# Patient Record
Sex: Male | Born: 1935 | Race: White | Hispanic: No
Health system: Southern US, Community
[De-identification: ages and names within clinical notes are randomized; demographics above are authoritative.]

---

## 2010-09-16 ENCOUNTER — Observation Stay: Payer: Self-pay | Admitting: Family Medicine

## 2011-05-17 ENCOUNTER — Emergency Department: Payer: Self-pay | Admitting: Emergency Medicine

## 2011-08-14 ENCOUNTER — Ambulatory Visit: Payer: Self-pay | Admitting: Cardiology

## 2011-08-14 ENCOUNTER — Emergency Department: Payer: Self-pay | Admitting: *Deleted

## 2011-08-19 ENCOUNTER — Ambulatory Visit: Payer: Self-pay | Admitting: Cardiology

## 2012-01-04 ENCOUNTER — Emergency Department: Payer: Self-pay | Admitting: Emergency Medicine

## 2012-01-09 ENCOUNTER — Inpatient Hospital Stay: Payer: Self-pay | Admitting: Family Medicine

## 2012-01-09 LAB — URINALYSIS, COMPLETE
Bilirubin,UR: NEGATIVE
Blood: NEGATIVE
Glucose,UR: NEGATIVE mg/dL (ref 0–75)
Hyaline Cast: 3
Ketone: NEGATIVE
Leukocyte Esterase: NEGATIVE
Ph: 6 (ref 4.5–8.0)
Protein: NEGATIVE
RBC,UR: NONE SEEN /HPF (ref 0–5)
Specific Gravity: 1.009 (ref 1.003–1.030)
WBC UR: NONE SEEN /HPF (ref 0–5)

## 2012-01-09 LAB — TROPONIN I: Troponin-I: 0.06 ng/mL — ABNORMAL HIGH

## 2012-01-09 LAB — CBC
HCT: 31.8 % — ABNORMAL LOW (ref 40.0–52.0)
HGB: 10.7 g/dL — ABNORMAL LOW (ref 13.0–18.0)
MCHC: 33.6 g/dL (ref 32.0–36.0)
MCV: 99 fL (ref 80–100)
Platelet: 189 10*3/uL (ref 150–440)
RBC: 3.21 10*6/uL — ABNORMAL LOW (ref 4.40–5.90)
RDW: 15.1 % — ABNORMAL HIGH (ref 11.5–14.5)

## 2012-01-09 LAB — COMPREHENSIVE METABOLIC PANEL
Alkaline Phosphatase: 90 U/L (ref 50–136)
Anion Gap: 7 (ref 7–16)
BUN: 70 mg/dL — ABNORMAL HIGH (ref 7–18)
Bilirubin,Total: 0.4 mg/dL (ref 0.2–1.0)
Co2: 28 mmol/L (ref 21–32)
Creatinine: 2.7 mg/dL — ABNORMAL HIGH (ref 0.60–1.30)
EGFR (African American): 23 — ABNORMAL LOW
EGFR (Non-African Amer.): 20 — ABNORMAL LOW
SGOT(AST): 14 U/L — ABNORMAL LOW (ref 15–37)
SGPT (ALT): 15 U/L
Sodium: 142 mmol/L (ref 136–145)
Total Protein: 7.3 g/dL (ref 6.4–8.2)

## 2012-01-09 LAB — CK TOTAL AND CKMB (NOT AT ARMC): CK, Total: 29 U/L — ABNORMAL LOW (ref 35–232)

## 2012-01-10 LAB — BASIC METABOLIC PANEL
Anion Gap: 9 (ref 7–16)
Calcium, Total: 8.4 mg/dL — ABNORMAL LOW (ref 8.5–10.1)
Chloride: 106 mmol/L (ref 98–107)
Co2: 24 mmol/L (ref 21–32)
EGFR (African American): 14 — ABNORMAL LOW
EGFR (Non-African Amer.): 12 — ABNORMAL LOW
Glucose: 104 mg/dL — ABNORMAL HIGH (ref 65–99)
Potassium: 5 mmol/L (ref 3.5–5.1)
Sodium: 139 mmol/L (ref 136–145)

## 2012-01-10 LAB — CBC WITH DIFFERENTIAL/PLATELET
Eosinophil #: 0 10*3/uL (ref 0.0–0.7)
HCT: 30.7 % — ABNORMAL LOW (ref 40.0–52.0)
Lymphocyte #: 1 10*3/uL (ref 1.0–3.6)
Lymphocyte %: 8.4 %
MCHC: 32.8 g/dL (ref 32.0–36.0)
Monocyte %: 7.8 %
Neutrophil #: 9.7 10*3/uL — ABNORMAL HIGH (ref 1.4–6.5)
Platelet: 183 10*3/uL (ref 150–440)
RBC: 3.1 10*6/uL — ABNORMAL LOW (ref 4.40–5.90)
WBC: 11.7 10*3/uL — ABNORMAL HIGH (ref 3.8–10.6)

## 2012-01-11 LAB — CBC WITH DIFFERENTIAL/PLATELET
Basophil #: 0 10*3/uL (ref 0.0–0.1)
Basophil %: 0.4 %
HGB: 9.9 g/dL — ABNORMAL LOW (ref 13.0–18.0)
Lymphocyte %: 16.8 %
MCH: 32.9 pg (ref 26.0–34.0)
MCV: 99 fL (ref 80–100)
Monocyte #: 0.9 x10 3/mm (ref 0.2–1.0)
Neutrophil %: 68.8 %
Platelet: 177 10*3/uL (ref 150–440)
RBC: 3.02 10*6/uL — ABNORMAL LOW (ref 4.40–5.90)
RDW: 14.9 % — ABNORMAL HIGH (ref 11.5–14.5)
WBC: 8 10*3/uL (ref 3.8–10.6)

## 2012-01-11 LAB — BASIC METABOLIC PANEL
BUN: 98 mg/dL — ABNORMAL HIGH (ref 7–18)
Creatinine: 4.12 mg/dL — ABNORMAL HIGH (ref 0.60–1.30)
EGFR (African American): 14 — ABNORMAL LOW
EGFR (Non-African Amer.): 12 — ABNORMAL LOW
Osmolality: 309 (ref 275–301)

## 2012-01-12 LAB — CBC WITH DIFFERENTIAL/PLATELET
Basophil %: 0.4 %
Eosinophil #: 0.2 10*3/uL (ref 0.0–0.7)
Eosinophil %: 2 %
HGB: 10.2 g/dL — ABNORMAL LOW (ref 13.0–18.0)
MCH: 33.4 pg (ref 26.0–34.0)
MCHC: 33.7 g/dL (ref 32.0–36.0)
MCV: 99 fL (ref 80–100)
Monocyte #: 0.9 x10 3/mm (ref 0.2–1.0)
Monocyte %: 10.4 %
Platelet: 184 10*3/uL (ref 150–440)

## 2012-01-12 LAB — BASIC METABOLIC PANEL
Calcium, Total: 8.3 mg/dL — ABNORMAL LOW (ref 8.5–10.1)
Chloride: 110 mmol/L — ABNORMAL HIGH (ref 98–107)
Co2: 24 mmol/L (ref 21–32)
Creatinine: 3.36 mg/dL — ABNORMAL HIGH (ref 0.60–1.30)
EGFR (African American): 18 — ABNORMAL LOW
EGFR (Non-African Amer.): 15 — ABNORMAL LOW
Glucose: 91 mg/dL (ref 65–99)
Osmolality: 309 (ref 275–301)
Sodium: 142 mmol/L (ref 136–145)

## 2012-01-12 LAB — CK: CK, Total: 55 U/L (ref 35–232)

## 2012-01-12 LAB — IRON AND TIBC
Iron Saturation: 17 %
Iron: 41 ug/dL — ABNORMAL LOW (ref 65–175)

## 2012-01-12 LAB — PHOSPHORUS: Phosphorus: 3.8 mg/dL (ref 2.5–4.9)

## 2012-01-13 LAB — CBC WITH DIFFERENTIAL/PLATELET
Basophil %: 0.4 %
Eosinophil #: 0.1 10*3/uL (ref 0.0–0.7)
Eosinophil %: 1.3 %
HCT: 31.8 % — ABNORMAL LOW (ref 40.0–52.0)
Lymphocyte %: 12.7 %
MCH: 32.7 pg (ref 26.0–34.0)
MCHC: 32.8 g/dL (ref 32.0–36.0)
MCV: 100 fL (ref 80–100)
Monocyte #: 1 x10 3/mm (ref 0.2–1.0)
Neutrophil #: 6.1 10*3/uL (ref 1.4–6.5)
Neutrophil %: 73.8 %
Platelet: 189 10*3/uL (ref 150–440)
RBC: 3.18 10*6/uL — ABNORMAL LOW (ref 4.40–5.90)
RDW: 15.3 % — ABNORMAL HIGH (ref 11.5–14.5)
WBC: 8.3 10*3/uL (ref 3.8–10.6)

## 2012-01-13 LAB — BASIC METABOLIC PANEL
Anion Gap: 8 (ref 7–16)
BUN: 75 mg/dL — ABNORMAL HIGH (ref 7–18)
Calcium, Total: 8.4 mg/dL — ABNORMAL LOW (ref 8.5–10.1)
Chloride: 111 mmol/L — ABNORMAL HIGH (ref 98–107)
Co2: 23 mmol/L (ref 21–32)
EGFR (African American): 20 — ABNORMAL LOW
Glucose: 88 mg/dL (ref 65–99)
Potassium: 4.8 mmol/L (ref 3.5–5.1)

## 2012-01-14 LAB — BASIC METABOLIC PANEL
Anion Gap: 7 (ref 7–16)
BUN: 73 mg/dL — ABNORMAL HIGH (ref 7–18)
Creatinine: 2.94 mg/dL — ABNORMAL HIGH (ref 0.60–1.30)
Osmolality: 306 (ref 275–301)
Sodium: 143 mmol/L (ref 136–145)

## 2012-02-07 DEATH — deceased

## 2012-12-03 IMAGING — CT CT CHEST-ABD-PELV W/O CM
1 of 2 series · 15 of 31 positions shown, 19 images · non-contrast
Comparison: None

REASON FOR EXAM: (1) REPORTED FALL YESTER DAY W/ LEFT RIB; (2) & LUQ PAIN
COMMENTS:   May transport without cardiac monitor

PROCEDURE:     CT  - CT CHEST ABDOMEN AND PELVIS WO  - January 04, 2012  [DATE]
RESULT:     CT CHEST, ABDOMEN, AND PELVIS
HISTORY: 
TECHNIQUE: Multiple axial images obtained from the thoracic inlet to the
pubic symphysis, without p.o. contrast and with with a intravenous contrast.

[Series 2: soft tissue · axial · 0.91mm/px · z∈[-1170,-575]mm · 15 of 133 slices shown, 19 images]
[im 7/133  mediastinal]
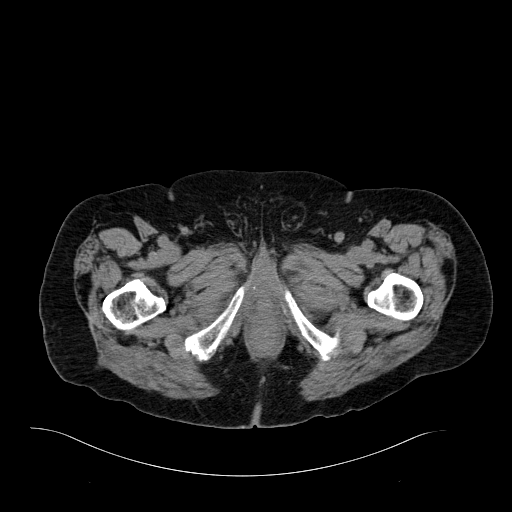
[im 7/133  bone]
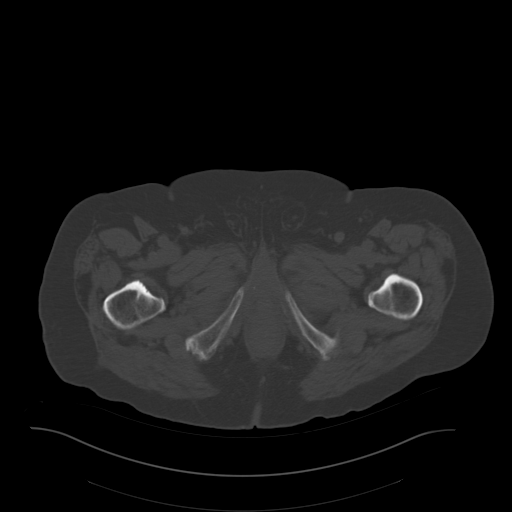
[im 21/133  mediastinal]
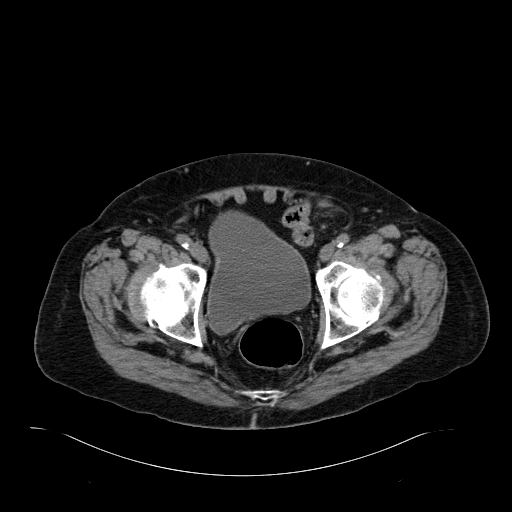
[im 35/133  mediastinal]
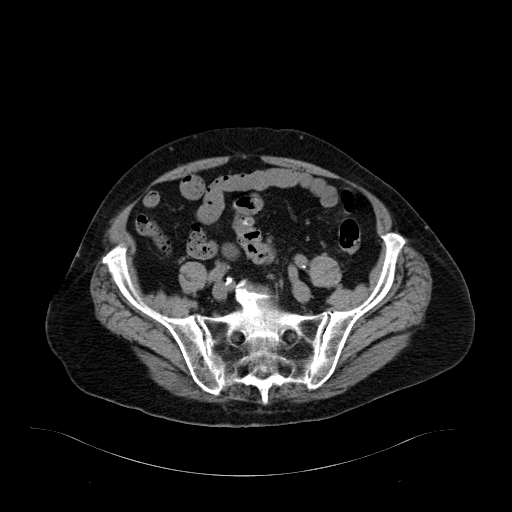
[im 42/133  mediastinal]
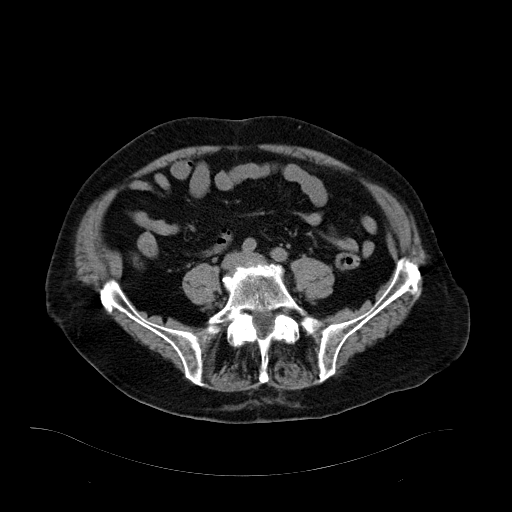
[im 49/133  mediastinal]
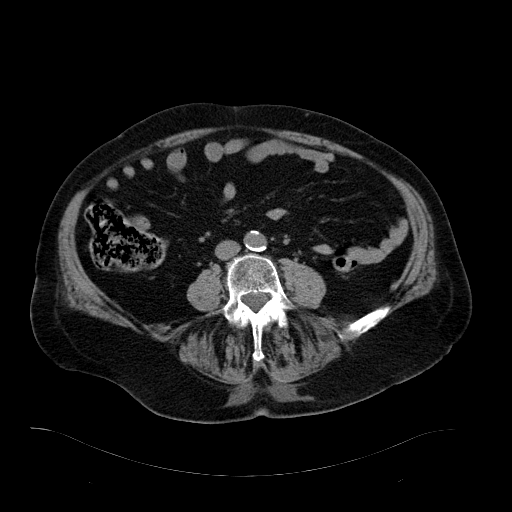
[im 56/133  mediastinal]
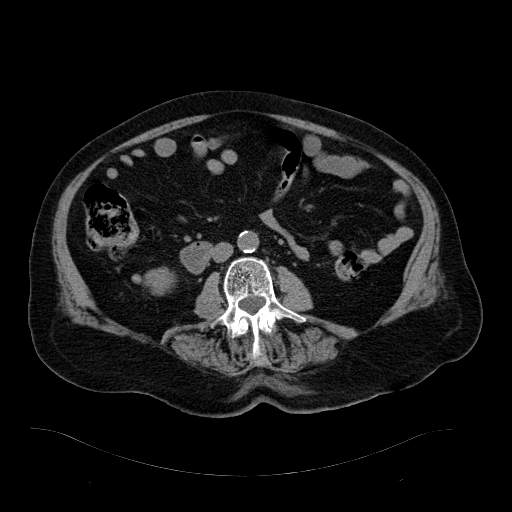
[im 65/133  mediastinal]
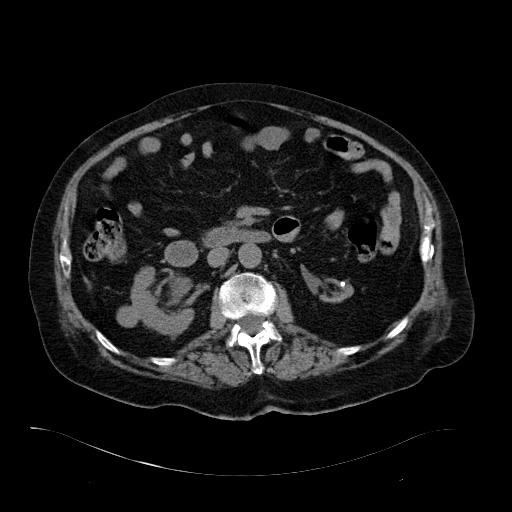
[im 77/133  mediastinal]
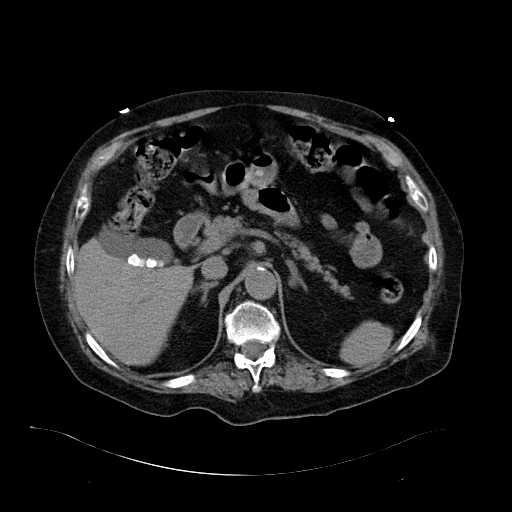
[im 84/133  mediastinal]
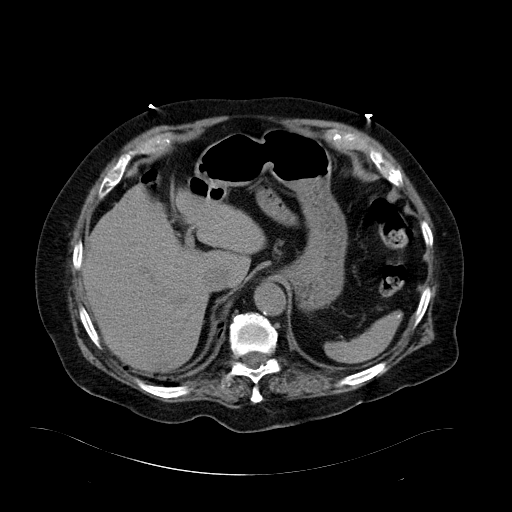
[im 84/133  bone]
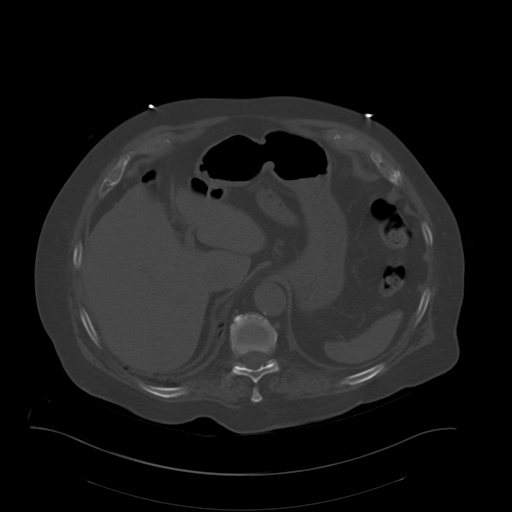
[im 91/133  mediastinal]
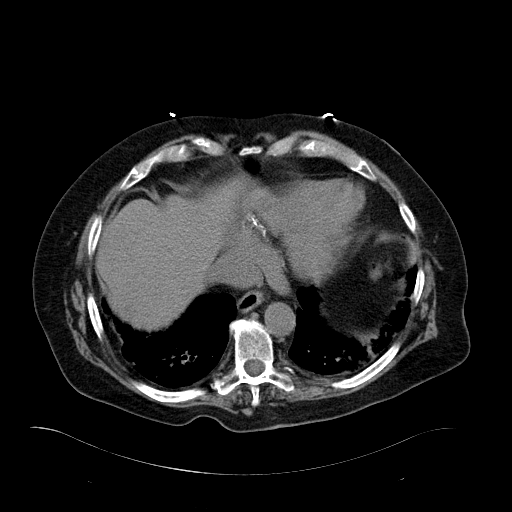
[im 98/133  mediastinal]
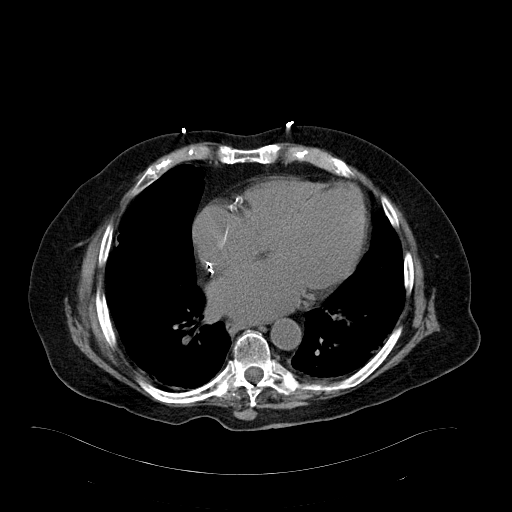
[im 105/133  lung]
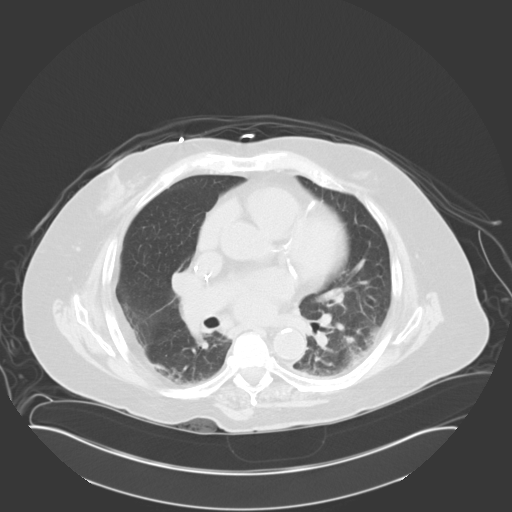
[im 112/133  mediastinal]
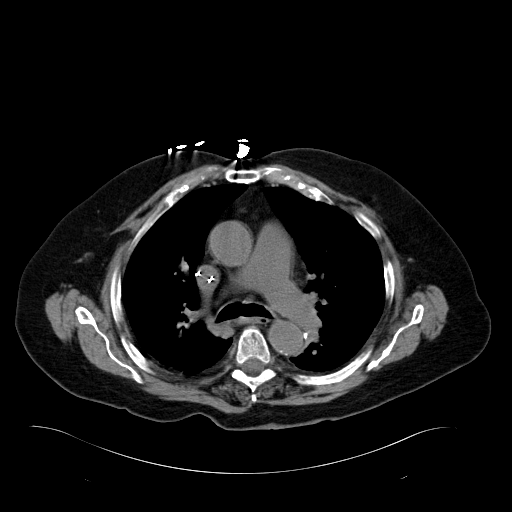
[im 112/133  lung]
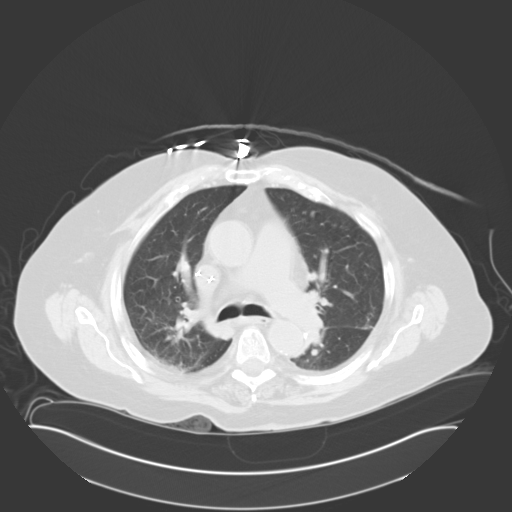
[im 119/133  lung]
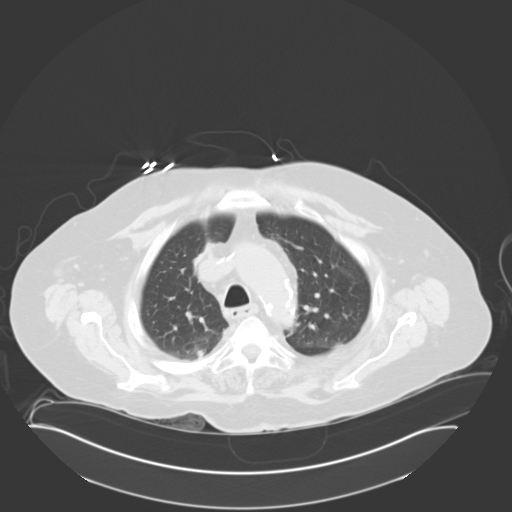
[im 126/133  mediastinal]
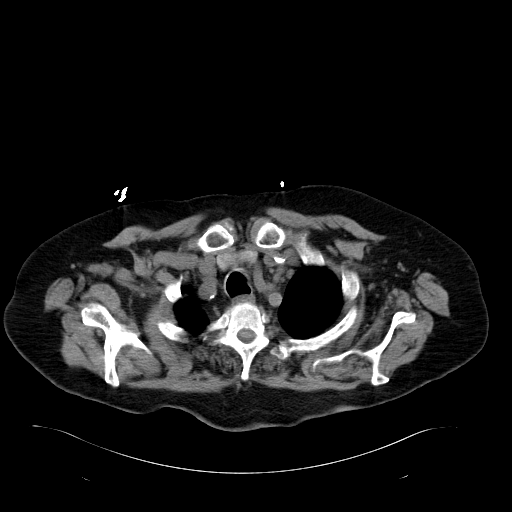
[im 126/133  lung]
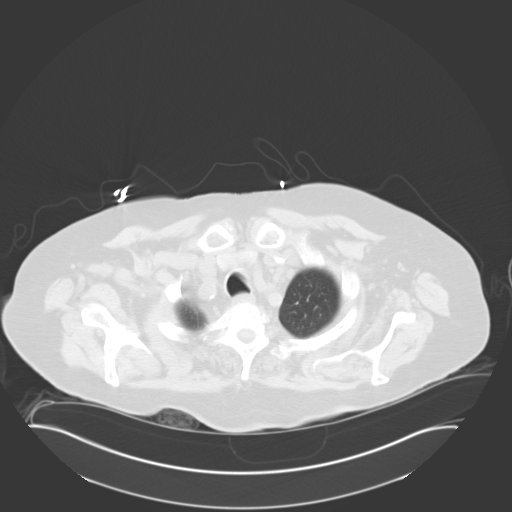

[15 of 31 positions shown; findings below may reference images not displayed]

FINDINGS: CHEST:

There is bibasilar interstitial thickening likely chronic with associated
bronchiectasis. There is no focal mass. There is no focal parenchymal
opacity, pleural effusion, or pneumothorax.

The heart size is enlarged. There is no pericardial effusion. There is
coronary artery atherosclerosis.

There are no pathologically enlarged mediastinal, hilar, or axillary lymph
nodes.

The osseous structures demonstrate no focal abnormality.

ABDOMEN/PELVIS:

The liver demonstrates no focal abnormality. There is no intrahepatic or
extrahepatic biliary ductal dilatation. There are cholelithiasis. The spleen
demonstrates no focal abnormality. The left kidney is severely atrophic. Is
a large parapelvic 3.7 cm cyst involving the right kidney. There is an
indeterminate 2.4 cm left renal interpolar mass. There is a hyperdense
indeterminate right upper pole renal mass measuring 2.3 cm. The  adrenal
glands, pancreas are normal. The bladder is unremarkable.

The stomach, duodenum, small intestine, and large intestine demonstrate no
contrast extravasation or dilatation. There is no pneumoperitoneum,
pneumatosis, or portal venous gas. There is no abdominal or pelvic free
fluid. There is no lymphadenopathy.

The abdominal aorta is normal in caliber with atherosclerosis.

There is lumbar spine spondylosis.
IMPRESSION: 1. No acute injury of the chest, abdomen or pelvis.

2. There are bibasilar chronic interstitial changes with bronchiectasis.

3. Cholelithiasis.

4. Coronary artery disease.

[REDACTED]

## 2012-12-08 IMAGING — CR DG CHEST 2V
1 series · 3 of 3 positions shown · non-contrast
Comparison: none

REASON FOR EXAM: RIGHT RIB PAIN SP FALL
COMMENTS:

[Series 1: x chest ap · 0.14mm/px · 3 of 3 slices shown]
[im 1/3]
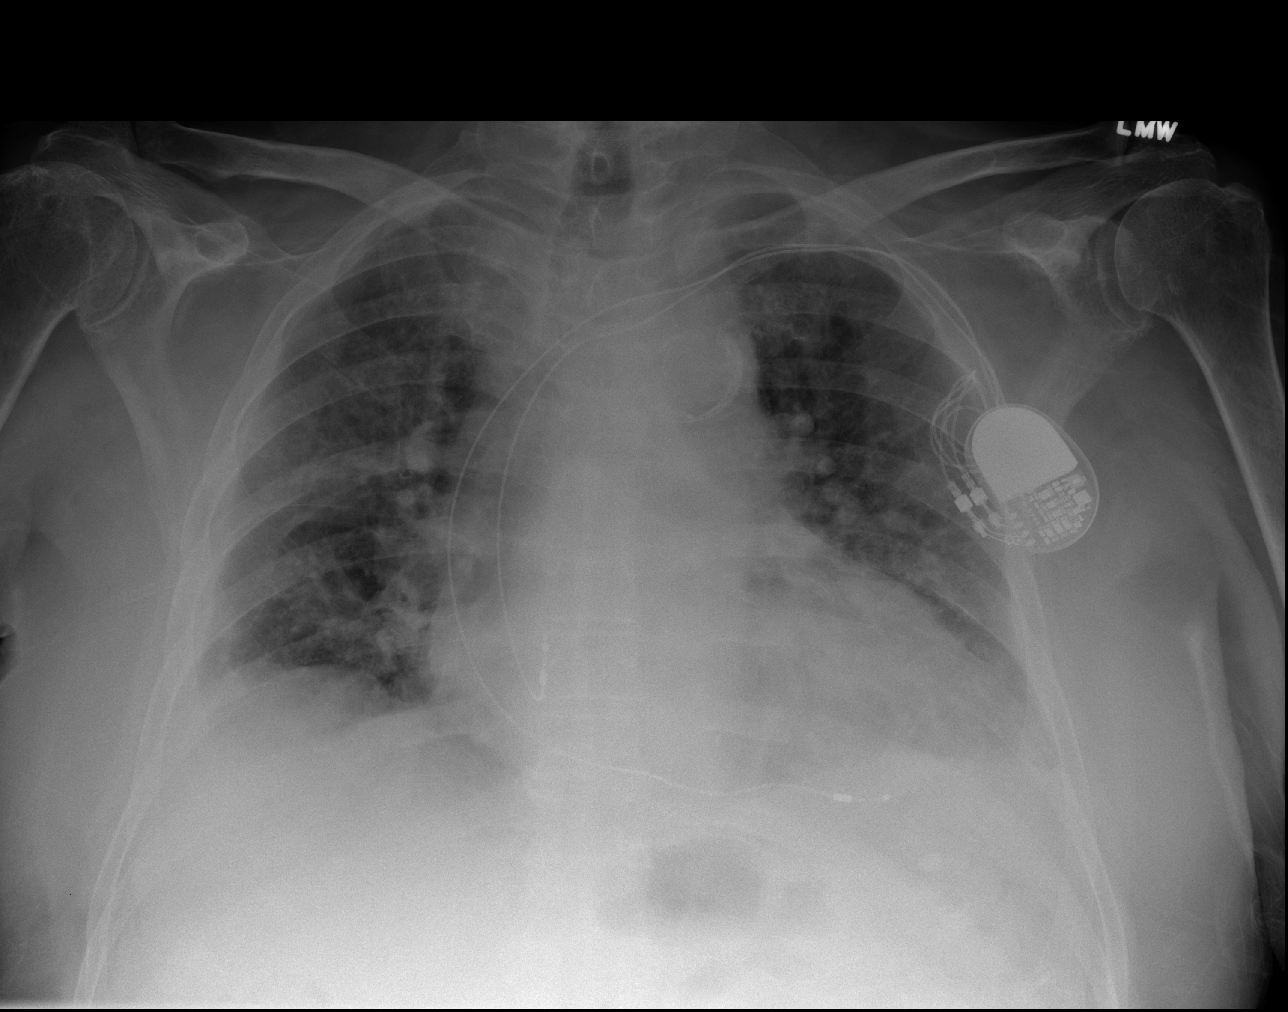
[im 2/3]
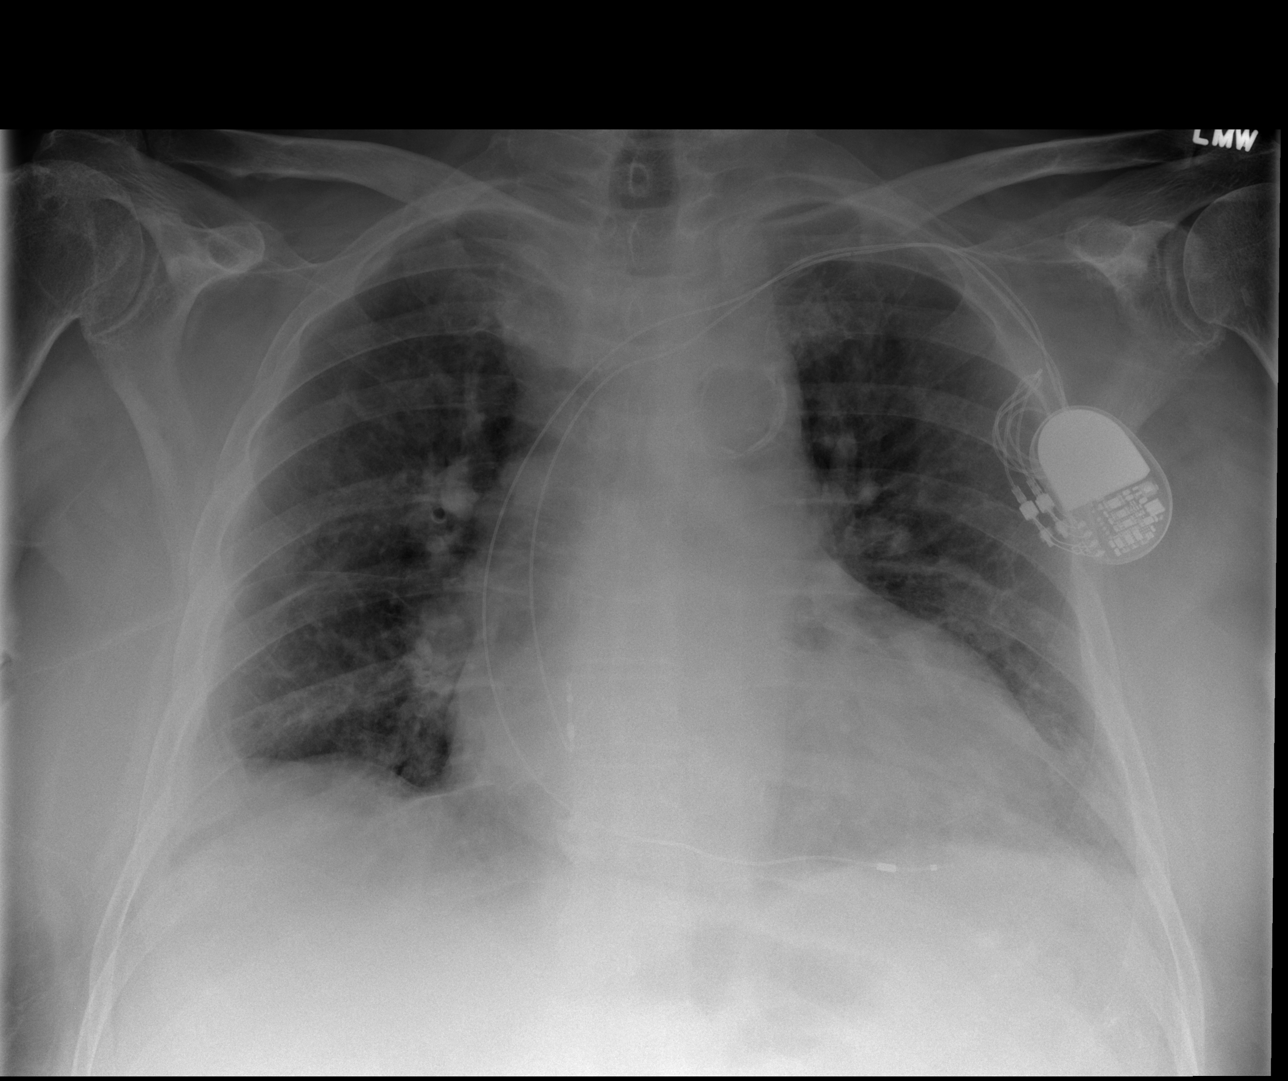
[im 3/3]
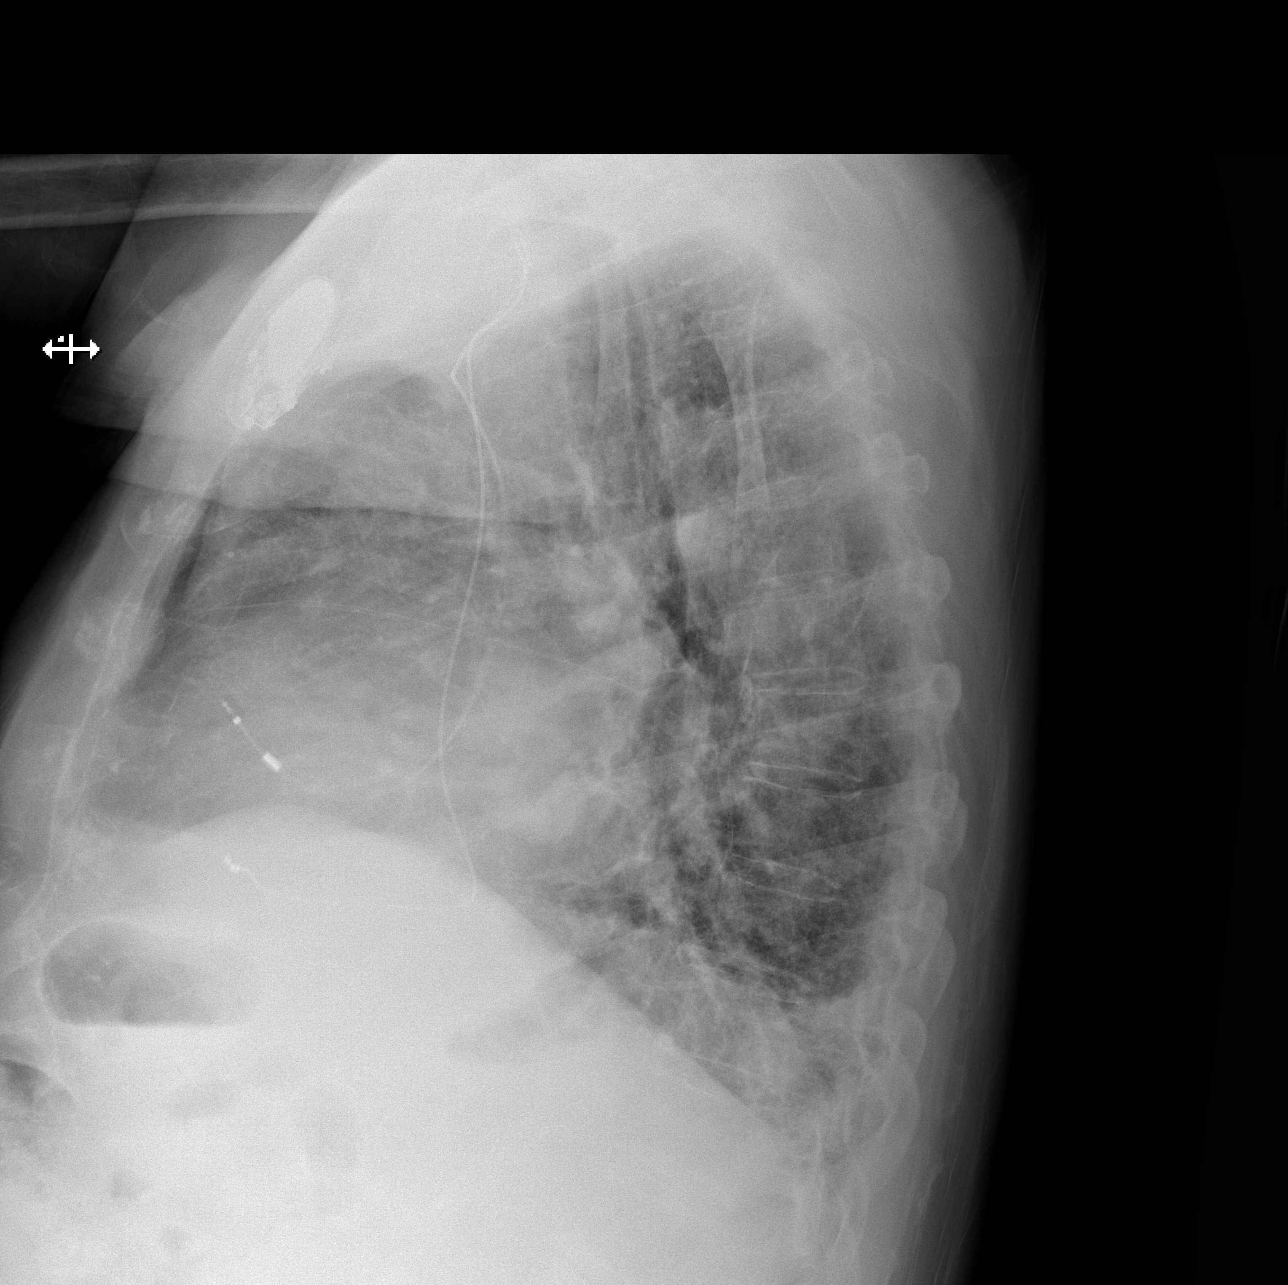

[3 of 3 positions shown; findings below may reference images not displayed]

PROCEDURE:     DXR - DXR CHEST PA (OR AP) AND LATERAL  - January 09, 2012  [DATE]

RESULT:     Comparison is made to the study 14 August, 2011.

The lung volumes are low. The interstitial markings are increased
bilaterally. The pulmonary vascularity is engorged. The cardiac silhouette
is enlarged. There is a permanent pacemaker in place.
IMPRESSION: The findings are consistent with pulmonary interstitial
edema likely of cardiac cause. I do not see evidence of a pneumothorax or
pulmonary contusion nor definite evidence of an acute rib fracture.

[REDACTED]

## 2014-12-31 NOTE — Discharge Summary (Signed)
PATIENT NAME:  Sherryle LisFOX, Zaxton N MR#:  [id number] DATE OF BIRTH:  1919-01-23  DATE OF ADMISSION:  01/09/2012 DATE OF DISCHARGE:  01/14/2012   DISCHARGE DIAGNOSES:  1. Severe dementia/delirium but back to baseline.  2. Noncardiac chest discomfort secondary to fall.  3. Acute on chronic renal failure. 4. Systolic congestive heart failure.   DISCHARGE MEDICATIONS:  1. Aspirin 81 mg p.o. daily.  2. Aricept 10 mg p.o. at bedtime.  3. Coreg 3.125 mg p.o. b.i.d.  4. Haloperidol 0.5 mg p.o. q.4 hours p.r.n. for agitation, psychosis.  5. Senokot 1 tab p.o. daily p.r.n. for constipation.  6. Namenda 5 mg p.o. at bedtime.  7. Colace 100 mg p.o. b.i.d.  8. Albuterol nebs q.4 hours p.r.n. for wheezing.   DISCHARGE INSTRUCTIONS: Please hold bumetanide and also lisinopril.   CONSULT: Nephrology   PROCEDURES: None.   PERTINENT LABS ON DAY OF DISCHARGE: Sodium 143, potassium 5, BUN 73, creatinine 2.94.   BRIEF HOSPITAL COURSE:  1. Chest discomfort, noncardiac. The patient initially came in because of complaints of chest discomfort secondary to a fall. He did not complain of any further chest discomfort during this hospitalization. First set of cardiac enzymes were negative. His EKG did not show any significant changes. Chest x-ray was negative for acute fractures and no acute abnormalities except for some interstitial edema. Cardiology consult was canceled and further work-up was canceled. He did get an echocardiogram that was consistent with systolic congestive heart failure which was already known.  2. Acute delirium with underlying chronic dementia. He has history of severe dementia on both Aricept and Namenda, unable to titrate up on Namenda due to drowsiness. His dementia returned to baseline status. No further intervention was necessary at this time. No source of infection.  3. Acute on chronic renal failure likely due to ATN. Lisinopril and bumetanide were held. He was placed on gentle hydration with  IV fluids and his renal function improved. He was back to baseline. Creatinine was 2.94 on day of discharge. He was seen by Nephrology per Dr. Cherylann RatelLateef who will plan to see him as an outpatient and may restart his ACE inhibitor and diuretic at that time if appropriate.  4. Systolic congestive heart failure, unchanged. The patient was asymptomatic during his hospitalization. No dyspnea. No peripheral edema. Will need to continue with monitoring of his weight and also his respiratory and fluid status. His lisinopril and bumetanide were held at this time. Will continue with the Coreg. Will restart those other medications per Dr. Cherylann RatelLateef.   DISPOSITION: He is in stable condition to be discharged back to the [facility]. Will need follow-up with Dr. Burnadette PopLinthavong in 10 days and follow-up with Dr. Cherylann RatelLateef in one week.   ____________________________ Marisue IvanKanhka Martrice Apt, MD kl:drc D: 01/14/2012 08:09:02 ET T: 01/14/2012 12:14:35 ET JOB#: [id number]  cc: Marisue IvanKanhka Annaka Cleaver, MD, <Dictator> Munsoor Lizabeth LeydenN. Lateef, MD Marisue IvanKANHKA Donald Memoli MD ELECTRONICALLY SIGNED 02/06/2012 17:30

## 2014-12-31 NOTE — H&P (Signed)
PATIENT NAME:  Kyle Jimenez, CERVONE MR#:  [id number] DATE OF BIRTH:  20-Mar-1919  DATE OF ADMISSION:  01/09/2012  HISTORY OF PRESENT ILLNESS: This is a 81 year old Caucasian gentleman who is a nursing home resident with previous past medical and surgical history of:  1. Advanced dementia with intermittent episodes of delirium.  2. Chronic kidney disease stage IV.  3. Coronary artery disease.  4. History of basal cell cancer.  5. Chronic congestive heart failure, unclear type.  6. History status post pacemaker.   This is a 81 year old gentleman who lives at [facility] nursing home was brought in to the ER after he sustained a fall at nursing home and then started complaining left-sided chest wall discomfort. Of note, patient has advanced dementia and is a very poor historian. Once he came to the ER he had a chest x-ray and a CT chest which did not show any acute findings, he was found to have elevated troponin which has been elevated in the past, question whether this is chronic due to his renal failure. He was supposedly being discharged back to the nursing home, however, prior to the discharge in the ER patient swallowed some tissue paper from the box of tissue paper he had, subsequently he started to cough. There was a question whether he aspirated. Repeat chest x-rays were inconclusive. I was called to admit the patient for elevated troponin, possible aspiration of tissue paper in the ER.   PAST MEDICAL AND SURGICAL HISTORY: As above.  REVIEW OF SYSTEMS, FAMILY HISTORY AND PERSONAL HISTORY: Unobtainable due to patient's advanced dementia and inability to answer questions.   NURSING HOME MEDICATIONS:  1. Aspirin 81 mg p.o. daily.  2. Bumex 0.5 mg p.o. daily.  3. Coreg 3.125 mg p.o. daily.  4. Donepezil 10 mg p.o. daily.  5. Lisinopril 5 mg p.o. daily.  6. Namenda 5 mg p.o. daily.  7. Senna Plus tab 1 oral daily.  8. Haldol 0.5 p.o. q.8 p.r.n. agitation   ALLERGIES: No known drug allergies  listed.   PHYSICAL EXAMINATION:  VITAL SIGNS: Temperature 98.6, pulse 68, respiration 18, blood pressure 89/48, 97% on room air.   GENERAL: Elderly gentleman lying in hospital bed, confused, unable to reliably answer questions, however, in no apparent distress. He does have mild cough.   HEENT: Normocephalic, atraumatic head. Pupils equal, reactive to light. Pink and moist tongue and throat.   NECK: Supple neck.   PSYCH: Unobtainable.   CNS: Moving all four extremities spontaneously. No apparent focal neurological deficits.   PULMONARY: Symmetrical chest wall movement. Minimal bibasilar rales.   CARDIOVASCULAR: Regular rhythm. Normal S1, S2. No gallops, rubs or murmurs.   ABDOMEN: Distended. He has hypoactive bowel sounds. No obvious tenderness.   EXTREMITIES: No cyanosis, clubbing. No edema. No obvious joint effusions.   LABORATORY, DIAGNOSTIC AND RADIOLOGICAL DATA: White count 7.1, hemoglobin 10.7, hematocrit 31.8, platelets 189, sodium 142, potassium 4.2, chloride 107, bicarbonate 28, BUN 70, creatinine 2.7. Creatinine close to his baseline but BUN is slightly elevated. AST 14, ALT 15, alkaline phosphatase 90, total bilirubin 0.4, troponin 0.06, CK 29, CK-MB 1.3. Previous troponin 0.07. Urinalysis unremarkable. CT chest prior to his episode of aspiration shows bronchiectasis, gallstones in the gallbladder without any other signs of cholecystitis. Questionable right upper pole right kidney abnormality. Ultrasound requested no rib fracture.    EKG shows a paced rhythm, 60 beats per minute.   ASSESSMENT AND PLAN:  1. Fall at nursing home with chest wall discomfort, mildly elevated troponin.  Patient has history of coronary artery disease, but previous troponins in the system around same level, I question whether this is chronic due to his renal insufficiency. His CK and CK-MB are normal. Will admit the patient on telemetry bed. Will cycle his cardiac enzymes which will be CK, CK-MB and  troponins q.8 x3, will order an echogram, will continue aspirin, Coreg once his blood pressure is better. Will request Dr. Darrold JunkerParaschos, his cardiologist, to see the patient.  2. Questionable aspiration of tissue paper in the ER. Repeat chest x-ray remains inconclusive, for now will withhold antibiotics. If patient continues to develop any worsening shortness of breath, if he runs a fever, develops leukocytosis antibiotics should be considered, will repeat chest x-ray in the morning. Will request speech therapy to see the patient.  3. Distended abdomen. Patient unable to complain of any discomfort. Will obtain a KUB. He might be constipated. Please monitor results.   4. Questionable right upper pole kidney abnormality. Consider ultrasound of the kidneys.  5. Advanced dementia. Will continue home medications. Will order aspiration and fall precautions  6. Elevated BUN with possible mild dehydration. Patient does not have any edema. Does have some minimal bibasilar rales. Will hold his Bumex, today will also hold his Coreg and lisinopril for today as his blood pressure is on the lower side. Will not give him any IV fluids for now as he might go in congestive heart failure. If his blood pressure trends lower gentle IV fluids can be considered. Of note, his Bumex, Coreg and lisinopril have been ordered for tomorrow. Please evaluate further holding if there are signs of dehydration.  7. Patient to get heparin for deep vein thrombosis prophylaxis.  8. Patient does not have any documentation of being DO NOT RESUSCITATE, unable to express his wishes. Will keep him FULL CODE but this needs to be addressed with the patient should be a DO NOT RESUSCITATE or not. Appears that patient has poor quality of life at baseline.   ____________________________ Stanford ScotlandPrashant K. Thedore MinsSingh, MD pks:cms D: 01/09/2012 06:00:30 ET T: 01/09/2012 06:19:47 ET JOB#: [id number]  cc: Bess HarvestPrashant K. Thedore MinsSingh, MD, <Dictator> Stanford ScotlandPRASHANT K [facility] HospitalINGH  MD ELECTRONICALLY SIGNED 01/09/2012 7:21
